# Patient Record
Sex: Female | Born: 2000 | Hispanic: No | Marital: Single | Smoking: Never smoker
Health system: Southern US, Community
[De-identification: ages and names within clinical notes are randomized; demographics above are authoritative.]

---
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
## 2019-04-08 DIAGNOSIS — Z1159 Encounter for screening for other viral diseases: Secondary | ICD-10-CM | POA: Diagnosis not present

## 2019-08-19 DIAGNOSIS — Z20828 Contact with and (suspected) exposure to other viral communicable diseases: Secondary | ICD-10-CM | POA: Diagnosis not present

## 2019-08-23 DIAGNOSIS — Z20828 Contact with and (suspected) exposure to other viral communicable diseases: Secondary | ICD-10-CM | POA: Diagnosis not present

## 2019-11-03 DIAGNOSIS — Z20828 Contact with and (suspected) exposure to other viral communicable diseases: Secondary | ICD-10-CM | POA: Diagnosis not present

## 2020-01-05 DIAGNOSIS — Z20828 Contact with and (suspected) exposure to other viral communicable diseases: Secondary | ICD-10-CM | POA: Diagnosis not present

## 2020-01-15 DIAGNOSIS — Z20828 Contact with and (suspected) exposure to other viral communicable diseases: Secondary | ICD-10-CM | POA: Diagnosis not present

## 2020-01-21 DIAGNOSIS — Z20828 Contact with and (suspected) exposure to other viral communicable diseases: Secondary | ICD-10-CM | POA: Diagnosis not present

## 2020-01-27 DIAGNOSIS — Z20828 Contact with and (suspected) exposure to other viral communicable diseases: Secondary | ICD-10-CM | POA: Diagnosis not present

## 2020-02-19 DIAGNOSIS — Z20828 Contact with and (suspected) exposure to other viral communicable diseases: Secondary | ICD-10-CM | POA: Diagnosis not present

## 2020-02-19 DIAGNOSIS — Z03818 Encounter for observation for suspected exposure to other biological agents ruled out: Secondary | ICD-10-CM | POA: Diagnosis not present

## 2020-02-25 DIAGNOSIS — Z20828 Contact with and (suspected) exposure to other viral communicable diseases: Secondary | ICD-10-CM | POA: Diagnosis not present

## 2020-02-25 DIAGNOSIS — Z03818 Encounter for observation for suspected exposure to other biological agents ruled out: Secondary | ICD-10-CM | POA: Diagnosis not present

## 2020-05-04 DIAGNOSIS — Z309 Encounter for contraceptive management, unspecified: Secondary | ICD-10-CM | POA: Diagnosis not present

## 2020-05-04 DIAGNOSIS — Z0000 Encounter for general adult medical examination without abnormal findings: Secondary | ICD-10-CM | POA: Diagnosis not present

## 2020-05-04 DIAGNOSIS — N946 Dysmenorrhea, unspecified: Secondary | ICD-10-CM | POA: Diagnosis not present

## 2020-10-28 DIAGNOSIS — Z20822 Contact with and (suspected) exposure to covid-19: Secondary | ICD-10-CM | POA: Diagnosis not present

## 2021-05-09 DIAGNOSIS — Z0000 Encounter for general adult medical examination without abnormal findings: Secondary | ICD-10-CM | POA: Diagnosis not present

## 2021-05-09 DIAGNOSIS — Z13220 Encounter for screening for lipoid disorders: Secondary | ICD-10-CM | POA: Diagnosis not present

## 2021-05-09 DIAGNOSIS — R7989 Other specified abnormal findings of blood chemistry: Secondary | ICD-10-CM | POA: Diagnosis not present

## 2021-05-09 DIAGNOSIS — R194 Change in bowel habit: Secondary | ICD-10-CM | POA: Diagnosis not present

## 2021-05-09 DIAGNOSIS — R7309 Other abnormal glucose: Secondary | ICD-10-CM | POA: Diagnosis not present

## 2021-05-09 DIAGNOSIS — Z124 Encounter for screening for malignant neoplasm of cervix: Secondary | ICD-10-CM | POA: Diagnosis not present

## 2024-10-06 ENCOUNTER — Encounter (HOSPITAL_BASED_OUTPATIENT_CLINIC_OR_DEPARTMENT_OTHER): Payer: Self-pay

## 2024-10-06 ENCOUNTER — Other Ambulatory Visit: Payer: Self-pay

## 2024-10-06 DIAGNOSIS — F17210 Nicotine dependence, cigarettes, uncomplicated: Secondary | ICD-10-CM | POA: Diagnosis present

## 2024-10-06 DIAGNOSIS — K8060 Calculus of gallbladder and bile duct with cholecystitis, unspecified, without obstruction: Principal | ICD-10-CM | POA: Diagnosis present

## 2024-10-06 LAB — CBC
HCT: 36 % (ref 36.0–46.0)
Hemoglobin: 12.2 g/dL (ref 12.0–15.0)
MCH: 29.5 pg (ref 26.0–34.0)
MCHC: 33.9 g/dL (ref 30.0–36.0)
MCV: 87.2 fL (ref 80.0–100.0)
Platelets: 348 K/uL (ref 150–400)
RBC: 4.13 MIL/uL (ref 3.87–5.11)
RDW: 12.7 % (ref 11.5–15.5)
WBC: 7.2 K/uL (ref 4.0–10.5)
nRBC: 0 % (ref 0.0–0.2)

## 2024-10-06 NOTE — ED Triage Notes (Signed)
 Pt arrives with c/o epigastric pain that started about 2 months ago. Pt reports the pain radiates into her back. Pt reports burping sometimes relieves pain. Pt denies n/v. Pt reports lightheadedness.

## 2024-10-07 ENCOUNTER — Emergency Department (HOSPITAL_BASED_OUTPATIENT_CLINIC_OR_DEPARTMENT_OTHER)

## 2024-10-07 ENCOUNTER — Encounter (HOSPITAL_COMMUNITY): Admission: EM | Disposition: A | Discharge status: Home / Self Care | Payer: Self-pay | Source: Home / Self Care

## 2024-10-07 ENCOUNTER — Emergency Department (HOSPITAL_COMMUNITY): Admitting: Anesthesiology

## 2024-10-07 ENCOUNTER — Emergency Department (HOSPITAL_COMMUNITY)

## 2024-10-07 ENCOUNTER — Encounter (HOSPITAL_COMMUNITY): Payer: Self-pay | Admitting: Anesthesiology

## 2024-10-07 ENCOUNTER — Inpatient Hospital Stay (HOSPITAL_BASED_OUTPATIENT_CLINIC_OR_DEPARTMENT_OTHER): Admission: EM | Admit: 2024-10-07 | Discharge: 2024-10-08 | Disposition: A | Payer: Self-pay

## 2024-10-07 DIAGNOSIS — K8050 Calculus of bile duct without cholangitis or cholecystitis without obstruction: Principal | ICD-10-CM

## 2024-10-07 DIAGNOSIS — Z7401 Bed confinement status: Secondary | ICD-10-CM | POA: Diagnosis not present

## 2024-10-07 DIAGNOSIS — K8020 Calculus of gallbladder without cholecystitis without obstruction: Secondary | ICD-10-CM | POA: Diagnosis present

## 2024-10-07 DIAGNOSIS — K8060 Calculus of gallbladder and bile duct with cholecystitis, unspecified, without obstruction: Secondary | ICD-10-CM | POA: Diagnosis present

## 2024-10-07 DIAGNOSIS — K819 Cholecystitis, unspecified: Secondary | ICD-10-CM | POA: Diagnosis not present

## 2024-10-07 DIAGNOSIS — I959 Hypotension, unspecified: Secondary | ICD-10-CM | POA: Diagnosis not present

## 2024-10-07 DIAGNOSIS — F17210 Nicotine dependence, cigarettes, uncomplicated: Secondary | ICD-10-CM | POA: Diagnosis present

## 2024-10-07 DIAGNOSIS — K828 Other specified diseases of gallbladder: Secondary | ICD-10-CM | POA: Diagnosis not present

## 2024-10-07 HISTORY — PX: CHOLECYSTECTOMY: SHX55

## 2024-10-07 LAB — COMPREHENSIVE METABOLIC PANEL WITH GFR
ALT: 131 U/L — ABNORMAL HIGH (ref 0–44)
AST: 131 U/L — ABNORMAL HIGH (ref 15–41)
Albumin: 4.4 g/dL (ref 3.5–5.0)
Alkaline Phosphatase: 144 U/L — ABNORMAL HIGH (ref 38–126)
Anion gap: 17 — ABNORMAL HIGH (ref 5–15)
BUN: 10 mg/dL (ref 6–20)
CO2: 18 mmol/L — ABNORMAL LOW (ref 22–32)
Calcium: 9.3 mg/dL (ref 8.9–10.3)
Chloride: 104 mmol/L (ref 98–111)
Creatinine, Ser: 0.94 mg/dL (ref 0.44–1.00)
GFR, Estimated: >60 mL/min (ref >60)
Glucose, Bld: 116 mg/dL — ABNORMAL HIGH (ref 70–99)
Potassium: 3.5 mmol/L (ref 3.5–5.1)
Sodium: 138 mmol/L (ref 135–145)
Total Bilirubin: 1.1 mg/dL (ref 0.0–1.2)
Total Protein: 7.7 g/dL (ref 6.5–8.1)

## 2024-10-07 LAB — URINALYSIS, MICROSCOPIC (REFLEX)

## 2024-10-07 LAB — URINALYSIS, ROUTINE W REFLEX MICROSCOPIC
Glucose, UA: 100 mg/dL — AB
Hgb urine dipstick: NEGATIVE
Ketones, ur: 15 mg/dL — AB
Leukocytes,Ua: NEGATIVE
Nitrite: NEGATIVE
Protein, ur: >=300 mg/dL — AB
Specific Gravity, Urine: 1.025 (ref 1.005–1.030)
pH: 7 (ref 5.0–8.0)

## 2024-10-07 LAB — LIPASE, BLOOD: Lipase: 66 U/L — ABNORMAL HIGH (ref 11–51)

## 2024-10-07 LAB — PREGNANCY, URINE: Preg Test, Ur: NEGATIVE

## 2024-10-07 SURGERY — LAPAROSCOPIC CHOLECYSTECTOMY WITH INTRAOPERATIVE CHOLANGIOGRAM
Anesthesia: General

## 2024-10-07 MED ORDER — CHLORHEXIDINE GLUCONATE 0.12 % MT SOLN
15.0000 mL | Freq: Once | OROMUCOSAL | Status: AC
  Administered 2024-10-07: 11:00:00 15 mL via OROMUCOSAL

## 2024-10-07 MED ORDER — DROPERIDOL 2.5 MG/ML IJ SOLN
0.6250 mg | Freq: Once | INTRAMUSCULAR | Status: AC | PRN
  Administered 2024-10-07: 15:00:00 0.625 mg via INTRAVENOUS

## 2024-10-07 MED ORDER — FENTANYL CITRATE (PF) 250 MCG/5ML IJ SOLN
INTRAMUSCULAR | Status: DC | PRN
  Administered 2024-10-07 (×4): 50 ug via INTRAVENOUS

## 2024-10-07 MED ORDER — PROPOFOL 10 MG/ML IV BOLUS
INTRAVENOUS | Status: DC | PRN
  Administered 2024-10-07: 14:00:00 160 mg via INTRAVENOUS

## 2024-10-07 MED ORDER — ACETAMINOPHEN 500 MG PO TABS
1000.0000 mg | ORAL_TABLET | ORAL | Status: AC
  Administered 2024-10-07: 10:00:00 1000 mg via ORAL
  Filled 2024-10-07: qty 2

## 2024-10-07 MED ORDER — FENTANYL CITRATE (PF) 100 MCG/2ML IJ SOLN
INTRAMUSCULAR | Status: AC
  Filled 2024-10-07: qty 2

## 2024-10-07 MED ORDER — MIDAZOLAM HCL 2 MG/2ML IJ SOLN
INTRAMUSCULAR | Status: AC
  Filled 2024-10-07: qty 2

## 2024-10-07 MED ORDER — ONDANSETRON HCL 4 MG/2ML IJ SOLN
INTRAMUSCULAR | Status: DC | PRN
  Administered 2024-10-07: 15:00:00 4 mg via INTRAVENOUS

## 2024-10-07 MED ORDER — ONDANSETRON HCL 4 MG/2ML IJ SOLN: 4.0000 mg | INTRAMUSCULAR | Status: DC | PRN

## 2024-10-07 MED ORDER — ONDANSETRON 4 MG PO TBDP: 4.0000 mg | ORAL_TABLET | Freq: Four times a day (QID) | ORAL | Status: DC | PRN

## 2024-10-07 MED ORDER — ONDANSETRON HCL 4 MG/2ML IJ SOLN
INTRAMUSCULAR | Status: AC
  Filled 2024-10-07: qty 2

## 2024-10-07 MED ORDER — SODIUM CHLORIDE 0.9 % IV SOLN: INTRAVENOUS | Status: DC

## 2024-10-07 MED ORDER — KETOROLAC TROMETHAMINE 30 MG/ML IJ SOLN
INTRAMUSCULAR | Status: DC | PRN
  Administered 2024-10-07: 15:00:00 30 mg via INTRAVENOUS

## 2024-10-07 MED ORDER — 0.9 % SODIUM CHLORIDE (POUR BTL) OPTIME
TOPICAL | Status: DC | PRN
  Administered 2024-10-07: 15:00:00 1000 mL

## 2024-10-07 MED ORDER — ACETAMINOPHEN 10 MG/ML IV SOLN: 1000.0000 mg | Freq: Once | INTRAVENOUS | Status: DC | PRN

## 2024-10-07 MED ORDER — OXYCODONE HCL 5 MG/5ML PO SOLN: 5.0000 mg | Freq: Once | ORAL | Status: DC | PRN

## 2024-10-07 MED ORDER — MORPHINE SULFATE (PF) 2 MG/ML IV SOLN
1.0000 mg | INTRAVENOUS | Status: DC | PRN
  Administered 2024-10-07: 22:00:00 1 mg via INTRAVENOUS
  Filled 2024-10-07: qty 1

## 2024-10-07 MED ORDER — SODIUM CHLORIDE 0.9 % IV BOLUS
1000.0000 mL | Freq: Once | INTRAVENOUS | Status: AC
  Administered 2024-10-07: 03:00:00 1000 mL via INTRAVENOUS

## 2024-10-07 MED ORDER — KETOROLAC TROMETHAMINE 30 MG/ML IJ SOLN
INTRAMUSCULAR | Status: AC
  Filled 2024-10-07: qty 1

## 2024-10-07 MED ORDER — SODIUM CHLORIDE 0.9 % IV SOLN: Freq: Once | INTRAVENOUS | Status: AC

## 2024-10-07 MED ORDER — ORAL CARE MOUTH RINSE: 15.0000 mL | Freq: Once | OROMUCOSAL | Status: AC

## 2024-10-07 MED ORDER — DROPERIDOL 2.5 MG/ML IJ SOLN
INTRAMUSCULAR | Status: AC
  Filled 2024-10-07: qty 2

## 2024-10-07 MED ORDER — IOHEXOL 300 MG/ML  SOLN
INTRAMUSCULAR | Status: DC | PRN
  Administered 2024-10-07: 15:00:00 10 mL

## 2024-10-07 MED ORDER — FENTANYL CITRATE (PF) 50 MCG/ML IJ SOSY
25.0000 ug | PREFILLED_SYRINGE | INTRAMUSCULAR | Status: DC | PRN
  Administered 2024-10-07 (×2): 50 ug via INTRAVENOUS

## 2024-10-07 MED ORDER — IOHEXOL 300 MG/ML  SOLN
100.0000 mL | Freq: Once | INTRAMUSCULAR | Status: AC | PRN
  Administered 2024-10-07: 04:00:00 100 mL via INTRAVENOUS

## 2024-10-07 MED ORDER — BUPIVACAINE-EPINEPHRINE 0.25% -1:200000 IJ SOLN
INTRAMUSCULAR | Status: DC | PRN
  Administered 2024-10-07: 15:00:00 20 mL

## 2024-10-07 MED ORDER — LIDOCAINE HCL (CARDIAC) PF 100 MG/5ML IV SOSY
PREFILLED_SYRINGE | INTRAVENOUS | Status: DC | PRN
  Administered 2024-10-07: 14:00:00 60 mg via INTRAVENOUS

## 2024-10-07 MED ORDER — PANTOPRAZOLE SODIUM 40 MG IV SOLR
40.0000 mg | Freq: Every day | INTRAVENOUS | Status: DC
  Administered 2024-10-07: 22:00:00 40 mg via INTRAVENOUS
  Filled 2024-10-07: qty 10

## 2024-10-07 MED ORDER — LACTATED RINGERS IV SOLN: INTRAVENOUS | Status: DC

## 2024-10-07 MED ORDER — MIDAZOLAM HCL (PF) 2 MG/2ML IJ SOLN
INTRAMUSCULAR | Status: DC | PRN
  Administered 2024-10-07: 13:00:00 2 mg via INTRAVENOUS

## 2024-10-07 MED ORDER — BUPIVACAINE-EPINEPHRINE (PF) 0.25% -1:200000 IJ SOLN
INTRAMUSCULAR | Status: AC
  Filled 2024-10-07: qty 30

## 2024-10-07 MED ORDER — MORPHINE SULFATE (PF) 2 MG/ML IV SOLN: 1.0000 mg | INTRAVENOUS | Status: DC | PRN

## 2024-10-07 MED ORDER — FENTANYL CITRATE (PF) 50 MCG/ML IJ SOSY
PREFILLED_SYRINGE | INTRAMUSCULAR | Status: AC
  Filled 2024-10-07: qty 2

## 2024-10-07 MED ORDER — CEFAZOLIN SODIUM-DEXTROSE 2-4 GM/100ML-% IV SOLN
2.0000 g | Freq: Once | INTRAVENOUS | Status: AC
  Administered 2024-10-07: 14:00:00 2 g via INTRAVENOUS
  Filled 2024-10-07: qty 100

## 2024-10-07 MED ORDER — OXYCODONE HCL 5 MG PO TABS: 5.0000 mg | ORAL_TABLET | Freq: Once | ORAL | Status: DC | PRN

## 2024-10-07 MED ORDER — ONDANSETRON HCL 4 MG/2ML IJ SOLN: 4.0000 mg | Freq: Four times a day (QID) | INTRAMUSCULAR | Status: DC | PRN

## 2024-10-07 MED ORDER — ROCURONIUM BROMIDE 10 MG/ML (PF) SYRINGE
PREFILLED_SYRINGE | INTRAVENOUS | Status: DC | PRN
  Administered 2024-10-07: 14:00:00 50 mg via INTRAVENOUS

## 2024-10-07 MED ORDER — LIDOCAINE HCL (PF) 2 % IJ SOLN
INTRAMUSCULAR | Status: AC
  Filled 2024-10-07: qty 5

## 2024-10-07 MED ORDER — PROPOFOL 10 MG/ML IV BOLUS
INTRAVENOUS | Status: AC
  Filled 2024-10-07: qty 20

## 2024-10-07 MED ADMIN — Sugammadex Sodium IV 200 MG/2ML (Base Equivalent): 150 mg | INTRAVENOUS | @ 15:00:00 | NDC 00006542302

## 2024-10-07 MED ADMIN — Dexamethasone Sod Phosphate Preservative Free Inj 10 MG/ML: 6 mg | INTRAVENOUS | @ 14:00:00 | NDC 25021005301

## 2024-10-07 MED FILL — Fentanyl Citrate Preservative Free (PF) Inj 100 MCG/2ML: INTRAMUSCULAR | Qty: 2 | Status: AC

## 2024-10-07 SURGICAL SUPPLY — 32 items
BAG COUNTER SPONGE SURGICOUNT (BAG) IMPLANT
CATH REDDICK CHOLANGI 4FR 50CM (CATHETERS) ×1 IMPLANT
CHLORAPREP W/TINT 26 (MISCELLANEOUS) ×1 IMPLANT
CLIP APPLIE 5 13 M/L LIGAMAX5 (MISCELLANEOUS) ×1 IMPLANT
COVER MAYO STAND XLG (MISCELLANEOUS) ×1 IMPLANT
COVER SURGICAL LIGHT HANDLE (MISCELLANEOUS) ×1 IMPLANT
DERMABOND ADVANCED .7 DNX12 (GAUZE/BANDAGES/DRESSINGS) ×1 IMPLANT
DRAPE C-ARM 42X120 X-RAY (DRAPES) ×1 IMPLANT
ELECT REM PT RETURN 15FT ADLT (MISCELLANEOUS) ×1 IMPLANT
GAUZE PAD ABD 8X10 STRL (GAUZE/BANDAGES/DRESSINGS) IMPLANT
GAUZE SPONGE 4X4 12PLY STRL (GAUZE/BANDAGES/DRESSINGS) IMPLANT
GLOVE BIO SURGEON STRL SZ7.5 (GLOVE) ×1 IMPLANT
GOWN STRL REUS W/ TWL LRG LVL3 (GOWN DISPOSABLE) IMPLANT
HEMOSTAT SNOW SURGICEL 2X4 (HEMOSTASIS) IMPLANT
IRRIGATION SUCT STRKRFLW 2 WTP (MISCELLANEOUS) ×1 IMPLANT
IV CATH 14GX2 1/4 (CATHETERS) ×1 IMPLANT
KIT BASIN OR (CUSTOM PROCEDURE TRAY) ×1 IMPLANT
KIT IMAGING PINPOINTPAQ (MISCELLANEOUS) IMPLANT
KIT TURNOVER KIT A (KITS) ×1 IMPLANT
PENCIL SMOKE EVACUATOR (MISCELLANEOUS) IMPLANT
SCISSORS LAP 5X35 DISP (ENDOMECHANICALS) ×1 IMPLANT
SET TUBE SMOKE EVAC HIGH FLOW (TUBING) ×1 IMPLANT
SLEEVE Z-THREAD 5X100MM (TROCAR) ×2 IMPLANT
SPIKE FLUID TRANSFER (MISCELLANEOUS) ×1 IMPLANT
SUT MNCRL AB 4-0 PS2 18 (SUTURE) ×1 IMPLANT
SUT VICRYL 0 UR6 27IN ABS (SUTURE) ×2 IMPLANT
SYSTEM BAG RETRIEVAL 10MM (BASKET) ×1 IMPLANT
TAPE CLOTH SURG 4X10 WHT LF (GAUZE/BANDAGES/DRESSINGS) IMPLANT
TOWEL OR DSP ST BLU DLX 10/PK (DISPOSABLE) ×1 IMPLANT
TRAY LAPAROSCOPIC (CUSTOM PROCEDURE TRAY) ×1 IMPLANT
TROCAR BALLN 12MMX100 BLUNT (TROCAR) ×1 IMPLANT
TROCAR Z-THREAD OPTICAL 5X100M (TROCAR) ×1 IMPLANT

## 2024-10-07 NOTE — Op Note (Addendum)
 10/07/2024  2:45 PM  PATIENT:  Isabel Rivera  23 y.o. female  PRE-OPERATIVE DIAGNOSIS:  CHOLELITHIASIS  POST-OPERATIVE DIAGNOSIS:  CHOLELITHIASIS  PROCEDURE:  Procedures: LAPAROSCOPIC CHOLECYSTECTOMY WITH INTRAOPERATIVE CHOLANGIOGRAM (N/A)  SURGEON:  Surgeons and Role:    * Curvin Deward MOULD, MD - Primary  PHYSICIAN ASSISTANT:   ASSISTANTS: none   ANESTHESIA:   local and general  EBL:  minimal   BLOOD ADMINISTERED:none  DRAINS: none   LOCAL MEDICATIONS USED:  MARCAINE      SPECIMEN:  Source of Specimen:  gallbladder  DISPOSITION OF SPECIMEN:  PATHOLOGY  COUNTS:  YES  TOURNIQUET:  * No tourniquets in log *  DICTATION: .Dragon Dictation    Procedure: After informed consent was obtained the patient was brought to the operating room and placed in the supine position on the operating room table. After adequate induction of general anesthesia the patient's abdomen was prepped with ChloraPrep allowed to dry and draped in usual sterile manner. An appropriate timeout was performed. The area below the umbilicus was infiltrated with quarter percent  Marcaine . A small incision was made with a 15 blade knife. The incision was carried down through the subcutaneous tissue bluntly with a hemostat and [employer] retractors. The linea alba was identified. The linea alba was incised with a 15 blade knife and each side was grasped with Coker clamps. The preperitoneal space was then probed with a hemostat until the peritoneum was opened and access was gained to the abdominal cavity. A 0 Vicryl pursestring stitch was placed in the fascia surrounding the opening. A Hassan cannula was then placed through the opening and anchored in place with the previously placed Vicryl purse string stitch. The abdomen was insufflated with carbon dioxide without difficulty. A laparoscope was inserted through the [facility] cannula in the right upper quadrant was inspected. Next the epigastric region was infiltrated with %  Marcaine . A small incision was made with a 15 blade knife. A 5 mm port was placed bluntly through this incision into the abdominal cavity under direct vision. Next 2 sites were chosen laterally on the right side of the abdomen for placement of 5 mm ports. Each of these areas was infiltrated with quarter percent Marcaine . Small stab incisions were made with a 15 blade knife. 5 mm ports were then placed bluntly through these incisions into the abdominal cavity under direct vision without difficulty. A blunt grasper was placed through the lateralmost 5 mm port and used to grasp the dome of the gallbladder and elevate it anteriorly and superiorly. Another blunt grasper was placed through the other 5 mm port and used to retract the body and neck of the gallbladder. A dissector was placed through the epigastric port and using the electrocautery the peritoneal reflection at the gallbladder neck was opened. Blunt dissection was then carried out in this area until the gallbladder neck-cystic duct junction was readily identified and a good window was created. A single clip was placed on the gallbladder neck. A small  ductotomy was made just below the clip with laparoscopic scissors. A 14-gauge Angiocath was then placed through the anterior abdominal wall under direct vision. A Reddick cholangiogram catheter was then placed through the Angiocath and flushed. The catheter was then placed in the cystic duct and anchored in place with a clip. A cholangiogram was obtained that showed good emptying into the duodenum but a stone in the distal common bile duct. I could not reach it with my catheter because of the spiral nature of the  cystic duct.  The anchoring clip and catheters were then removed from the patient. 3 clips were placed proximally on the cystic duct and the duct was divided between the 2 sets of clips. Posterior to this the cystic artery was identified and again dissected bluntly in a circumferential manner until a  good window  was created. 2 clips were placed proximally and one distally on the artery and the artery was divided between the 2 sets of clips. Next a laparoscopic hook cautery device was used to separate the gallbladder from the liver bed. Prior to completely detaching the gallbladder from the liver bed the liver bed was inspected and several small bleeding points were coagulated with the electrocautery until the area was completely hemostatic. The gallbladder was then detached the rest of it from the liver bed without difficulty. A laparoscopic bag was inserted through the hassan port. The laparoscope was moved to the epigastric port. The gallbladder was placed within the bag and the bag was sealed.  The bag with the gallbladder was then removed with the [facility] cannula through the infraumbilical port without difficulty. The fascial defect was then closed with the previously placed Vicryl pursestring stitch as well as with another figure-of-eight 0 Vicryl stitch. The liver bed was inspected again and found to be hemostatic. The abdomen was irrigated with copious amounts of saline until the effluent was clear. The ports were then removed under direct vision without difficulty and were found to be hemostatic. The gas was allowed to escape. No other abnormalities were noted on general inspection of the abdomen. The skin incisions were all closed with interrupted 4-0 Monocryl subcuticular stitches. Dermabond dressings were applied. The patient tolerated the procedure well. At the end of the case all needle sponge and instrument counts were correct. The patient was then awakened and taken to recovery in stable condition  PLAN OF CARE: Admit to inpatient   PATIENT DISPOSITION:  PACU - hemodynamically stable.   Delay start of Pharmacological VTE agent (>24hrs) due to surgical blood loss or risk of bleeding: no

## 2024-10-07 NOTE — Transfer of Care (Signed)
 Immediate Anesthesia Transfer of Care Note  Patient: Isabel Rivera  Procedure(s) Performed: LAPAROSCOPIC CHOLECYSTECTOMY WITH INTRAOPERATIVE CHOLANGIOGRAM  Patient Location: PACU  Anesthesia Type:General  Level of Consciousness: oriented and drowsy  Airway & Oxygen Therapy: Patient Spontanous Breathing and Patient connected to face mask oxygen  Post-op Assessment: Report given to RN and Post -op Vital signs reviewed and stable  Post vital signs: Reviewed and stable  Last Vitals:  Vitals Value Taken Time  BP 119/82 10/07/24 15:00  Temp 97.3   Pulse 89 10/07/24 15:03  Resp 18 10/07/24 15:03  SpO2 100 % 10/07/24 15:03  Vitals shown include unfiled device data.  Last Pain:  Vitals:   10/07/24 0920  TempSrc: Oral  PainSc: 0-No pain      Patients Stated Pain Goal: 4 (10/07/24 0920)  Complications: No notable events documented.

## 2024-10-07 NOTE — Anesthesia Preprocedure Evaluation (Addendum)
 Anesthesia Evaluation  Patient identified by MRN, date of birth, ID band Patient awake    Reviewed: Allergy & Precautions, NPO status , Patient's Chart, lab work & pertinent test results  History of Anesthesia Complications Negative for: history of anesthetic complications  Airway Mallampati: II  TM Distance: >3 FB Neck ROM: Full    Dental no notable dental hx. (+) Teeth Intact   Pulmonary neg sleep apnea, neg COPD, Current Smoker and Patient abstained from smoking.   Pulmonary exam normal breath sounds clear to auscultation       Cardiovascular Exercise Tolerance: Good METS(-) hypertension(-) CAD and (-) Past MI negative cardio ROS (-) dysrhythmias  Rhythm:Regular Rate:Normal - Systolic murmurs    Neuro/Psych negative neurological ROS  negative psych ROS   GI/Hepatic ,neg GERD  ,,(+)     (-) substance abuse  Cholecystitis. Denies N/V.   Endo/Other  neg diabetes    Renal/GU negative Renal ROS     Musculoskeletal   Abdominal   Peds  Hematology   Anesthesia Other Findings History reviewed. No pertinent past medical history.  Reproductive/Obstetrics                              Anesthesia Physical Anesthesia Plan  ASA: 2  Anesthesia Plan: General   Post-op Pain Management: Tylenol  PO (pre-op)* and Toradol  IV (intra-op)*   Induction: Intravenous  PONV Risk Score and Plan: 4 or greater and Ondansetron , Dexamethasone  and Midazolam   Airway Management Planned: Oral ETT  Additional Equipment: None  Intra-op Plan:   Post-operative Plan: Extubation in OR  Informed Consent: I have reviewed the patients History and Physical, chart, labs and discussed the procedure including the risks, benefits and alternatives for the proposed anesthesia with the patient or authorized representative who has indicated his/her understanding and acceptance.     Dental advisory given  Plan Discussed  with: CRNA and Surgeon  Anesthesia Plan Comments: (Discussed risks of anesthesia with patient, including PONV, sore throat, lip/dental/eye damage. Rare risks discussed as well, such as cardiorespiratory and neurological sequelae, and allergic reactions. Discussed the role of CRNA in patient's perioperative care. Patient understands. Patient counseled on benefits of smoking cessation, and increased perioperative risks associated with continued smoking. )         Anesthesia Quick Evaluation

## 2024-10-07 NOTE — Plan of Care (Signed)
   Problem: Education: Goal: Knowledge of General Education information will improve Description Including pain rating scale, medication(s)/side effects and non-pharmacologic comfort measures Outcome: Progressing   Problem: Health Behavior/Discharge Planning: Goal: Ability to manage health-related needs will improve Outcome: Progressing

## 2024-10-07 NOTE — Discharge Instructions (Signed)

## 2024-10-07 NOTE — ED Notes (Signed)
 Report given to [facility]. Pt aware of pending transport and consent signed . Last PO intake 1700 10/06/24

## 2024-10-07 NOTE — Plan of Care (Signed)
   Problem: Clinical Measurements: Goal: Diagnostic test results will improve Outcome: Progressing   Problem: Activity: Goal: Risk for activity intolerance will decrease Outcome: Progressing

## 2024-10-07 NOTE — H&P (Signed)
 Isabel Rivera is an 23 y.o. female.   Chief Complaint: Abdominal pain HPI: The patient is a 23 year old female who began having epigastric abdominal pain back in June.  Initially the pain was happening about once a month but seems to be happening more frequently now she does feel bloated.  She denies any nausea or vomiting.  She came to the emergency department where a ultrasound did show stones in the gallbladder.  She may have had some mild gallbladder wall thickening.  Some of her liver functions were slightly elevated.  She feels a little bit better after arriving to the hospital.  She is otherwise young and in good health.  She does smoke.  History reviewed. No pertinent past medical history.  History reviewed. No pertinent surgical history.  History reviewed. No pertinent family history. Social History:  reports that she has never smoked. She has never used smokeless tobacco. She reports that she does not drink alcohol and does not use drugs.  Allergies: Allergies[1]  Medications Prior to Admission  Medication Sig Dispense Refill   acetaminophen  (TYLENOL ) 325 MG tablet Take 650 mg by mouth every 6 (six) hours as needed.     bismuth subsalicylate (PEPTO BISMOL) 262 MG/15ML suspension Take 30 mLs by mouth every 6 (six) hours as needed.     SPRINTEC 28 0.25-35 MG-MCG tablet Take 1 tablet by mouth daily.      Results for orders placed or performed during the hospital encounter of 10/07/24 (from the past 48 hours)  Lipase, blood     Status: Abnormal   Collection Time: 10/06/24 11:25 PM  Result Value Ref Range   Lipase 66 (H) 11 - 51 U/L    Comment: Performed at [facility], [address] [place], [place] [address]  Comprehensive metabolic panel     Status: Abnormal   Collection Time: 10/06/24 11:25 PM  Result Value Ref Range   Sodium 138 135 - 145 mmol/L   Potassium 3.5 3.5 - 5.1 mmol/L   Chloride 104 98 - 111 mmol/L   CO2 18 (L) 22 - 32 mmol/L   Glucose, Bld 116 (H) 70  - 99 mg/dL    Comment: Glucose reference range applies only to samples taken after fasting for at least 8 hours.   BUN 10 6 - 20 mg/dL   Creatinine, Ser 9.05 0.44 - 1.00 mg/dL   Calcium 9.3 8.9 - 89.6 mg/dL   Total Protein 7.7 6.5 - 8.1 g/dL   Albumin 4.4 3.5 - 5.0 g/dL   AST 868 (H) 15 - 41 U/L   ALT 131 (H) 0 - 44 U/L   Alkaline Phosphatase 144 (H) 38 - 126 U/L   Total Bilirubin 1.1 0.0 - 1.2 mg/dL   GFR, Estimated >39 >39 mL/min    Comment: (NOTE) Calculated using the CKD-EPI Creatinine Equation (2021)    Anion gap 17 (H) 5 - 15    Comment: Performed at [facility], [address] [place], [place] [address]  CBC     Status: None   Collection Time: 10/06/24 11:25 PM  Result Value Ref Range   WBC 7.2 4.0 - 10.5 K/uL   RBC 4.13 3.87 - 5.11 MIL/uL   Hemoglobin 12.2 12.0 - 15.0 g/dL   HCT 63.9 63.9 - 53.9 %   MCV 87.2 80.0 - 100.0 fL   MCH 29.5 26.0 - 34.0 pg   MCHC 33.9 30.0 - 36.0 g/dL   RDW 87.2 88.4 -  15.5 %   Platelets 348 150 - 400 K/uL   nRBC 0.0 0.0 - 0.2 %    Comment: Performed at [facility], [address] [place], [place] [address]  Urinalysis, Routine w reflex microscopic -Urine, Clean Catch     Status: Abnormal   Collection Time: 10/07/24  2:15 AM  Result Value Ref Range   Color, Urine ORANGE (A) YELLOW    Comment: BIOCHEMICALS MAY BE AFFECTED BY COLOR   APPearance CLOUDY (A) CLEAR   Specific Gravity, Urine 1.025 1.005 - 1.030   pH 7.0 5.0 - 8.0   Glucose, UA 100 (A) NEGATIVE mg/dL   Hgb urine dipstick NEGATIVE NEGATIVE   Bilirubin Urine MODERATE (A) NEGATIVE   Ketones, ur 15 (A) NEGATIVE mg/dL   Protein, ur >=699 (A) NEGATIVE mg/dL   Nitrite NEGATIVE NEGATIVE   Leukocytes,Ua NEGATIVE NEGATIVE    Comment: Performed at [facility], [address] [place], [place] [address]  Pregnancy, urine     Status: None   Collection Time: 10/07/24  2:15 AM  Result Value Ref Range   Preg Test, Ur NEGATIVE NEGATIVE    Comment:         THE SENSITIVITY OF THIS METHODOLOGY IS >20 mIU/mL. Performed at [facility], [address] [place], [place] [address]   Urinalysis, Microscopic (reflex)     Status: Abnormal   Collection Time: 10/07/24  2:15 AM  Result Value Ref Range   RBC / HPF 0-5 0 - 5 RBC/hpf   WBC, UA 0-5 0 - 5 WBC/hpf   Bacteria, UA MANY (A) NONE SEEN   Squamous Epithelial / HPF 0-5 0 - 5 /HPF   Mucus PRESENT    Hyaline Casts, UA PRESENT    Ca Oxalate Crys, UA PRESENT    Urine-Other LESS THAN 10 mL OF URINE SUBMITTED     Comment: Performed at [facility], [address] [place], [place] [address]   US  Abdomen Limited RUQ (LIVER/GB) Result Date: 10/07/2024 EXAM: Right Upper Quadrant Abdominal Ultrasound 10/07/2024 06:19:00 AM TECHNIQUE: Real-time ultrasonography of the right upper quadrant of the abdomen was performed. COMPARISON: CT of the abdomen and pelvis dated 10/07/2024. CLINICAL HISTORY: Transaminitis. FINDINGS: LIVER: The liver demonstrates normal echogenicity. No intrahepatic biliary ductal dilatation. No evidence of mass. BILIARY SYSTEM: No pericholecystic fluid or wall thickening. There are several stones within the gallbladder, with the largest measuring approximately 11 mm in diameter. The gallbladder is contracted. The patient is not focally tender over the gallbladder. The common bile duct measures 4 mm in diameter. RIGHT KIDNEY: The right kidney is grossly unremarkable in appearances without evidence of hydronephrosis, echogenic calculi or worrisome mass lesions. PANCREAS: Visualized portions of the pancreas are unremarkable. OTHER: No right upper quadrant ascites. IMPRESSION: 1. Contracted gallbladder with cholelithiasis, largest stone measuring approximately 11 mm. 2. No sonographic Murphy sign. Electronically signed by: Evalene Coho MD 10/07/2024 06:32 AM EST RP Workstation: [id number]   CT ABDOMEN PELVIS W CONTRAST Result Date: 10/07/2024 EXAM: CT ABDOMEN AND PELVIS  WITH CONTRAST 10/07/2024 03:42:08 AM TECHNIQUE: CT of the abdomen and pelvis was performed with the administration of 100 mL of iohexol  (OMNIPAQUE ) 300 MG/ML solution. Multiplanar reformatted images are provided for review. Automated exposure control, iterative reconstruction, and/or weight-based adjustment of the mA/kV was utilized to reduce the radiation dose to as low as reasonably achievable. COMPARISON: None available. CLINICAL HISTORY: Epigastric pain for 2 months. FINDINGS: LOWER CHEST: No  acute abnormality. LIVER: The liver is unremarkable. GALLBLADDER AND BILE DUCTS: Gallbladder is unremarkable. No biliary ductal dilatation. SPLEEN: No acute abnormality. PANCREAS: No acute abnormality. ADRENAL GLANDS: No acute abnormality. KIDNEYS, URETERS AND BLADDER: No stones in the kidneys or ureters. No hydronephrosis. No perinephric or periureteral stranding. Urinary bladder is unremarkable. GI AND BOWEL: Stomach demonstrates no acute abnormality. There is no bowel obstruction. PERITONEUM AND RETROPERITONEUM: No ascites. No free air. VASCULATURE: Aorta is normal in caliber. LYMPH NODES: No lymphadenopathy. REPRODUCTIVE ORGANS: No acute abnormality. BONES AND SOFT TISSUES: No acute osseous abnormality. No focal soft tissue abnormality. IMPRESSION: 1. No acute findings in the abdomen or pelvis. Electronically signed by: Oneil Devonshire MD 10/07/2024 03:45 AM EST RP Workstation: [id number]    Review of Systems  Constitutional: Negative.   HENT: Negative.    Eyes: Negative.   Respiratory: Negative.    Cardiovascular: Negative.   Gastrointestinal:  Positive for abdominal pain. Negative for vomiting.  Endocrine: Negative.   Genitourinary: Negative.   Musculoskeletal: Negative.   Skin: Negative.   Allergic/Immunologic: Negative.   Neurological: Negative.   Hematological: Negative.   Psychiatric/Behavioral: Negative.      Blood pressure 109/64, pulse 65, temperature 98.1 F (36.7 C), temperature source Oral,  resp. rate 16, weight 62.6 kg, SpO2 97%. Physical Exam Vitals reviewed.  Constitutional:      General: She is not in acute distress.    Appearance: Normal appearance.  HENT:     Head: Normocephalic and atraumatic.     Right Ear: External ear normal.     Left Ear: External ear normal.     Nose: Nose normal.     Mouth/Throat:     Mouth: Mucous membranes are moist.     Pharynx: Oropharynx is clear.  Eyes:     Extraocular Movements: Extraocular movements intact.     Conjunctiva/sclera: Conjunctivae normal.     Pupils: Pupils are equal, round, and reactive to light.  Cardiovascular:     Rate and Rhythm: Normal rate and regular rhythm.     Pulses: Normal pulses.     Heart sounds: Normal heart sounds.  Pulmonary:     Effort: Pulmonary effort is normal. No respiratory distress.     Breath sounds: Normal breath sounds.  Abdominal:     General: Abdomen is flat.     Palpations: Abdomen is soft.     Comments: Mild epigastric tenderness  Musculoskeletal:        General: No swelling or deformity. Normal range of motion.     Cervical back: Normal range of motion and neck supple.  Skin:    General: Skin is warm and dry.  Neurological:     General: No focal deficit present.     Mental Status: She is alert and oriented to person, place, and time.  Psychiatric:        Mood and Affect: Mood normal.        Behavior: Behavior normal.      Assessment/Plan The patient appears to have symptomatic gallstones.  Because of the risk of further painful episodes and possible pancreatitis I feel she would benefit from having her gallbladder removed.  She would also like to have it done.  I have discussed with her in detail the risks and benefits of the operation as well as some of the technical aspects including the risk of common bile duct injury and she understands and wishes to proceed.  We will plan for laparoscopic cholecystectomy with possible intraoperative cholangiogram today.  Deward Null III,  MD 10/07/2024, 10:28 AM       [1] No Known Allergies

## 2024-10-07 NOTE — ED Provider Notes (Signed)
 [place] [facility] AT [facility] Provider Note   CSN: [id number] Arrival date & time: 10/06/24  2307     Patient presents with: Abdominal Pain   Isabel Rivera is a 23 y.o. female.   The history is provided by the patient.  Abdominal Pain Isabel Rivera is a 23 y.o. female who presents to the Emergency Department complaining of abdominal pain.  She presents to the emergency department for evaluation of intermittent abdominal pain since August.  She has been experiencing episodes that mostly wake her from sleep but have occurred at other times.  Pain is located in the epigastric region.  Her episodes are becoming more intense and more frequent.  Symptoms started when she woke this morning.  They have been ongoing throughout the day.  Pain is described as pulsating.  She does have some associated mild indigestion.  Tonight the pain started radiating to her back.  It does not lateralize.  She does at times have increased belching.  She had diaphoresis with her episode tonight.  She also had nausea but no vomiting.  No associated fever, diarrhea, constipation, dysuria.  She has no known medical problems.  She does take oral contraceptives.  No prior abdominal surgeries.  She did try Tylenol  as well as Pepto today without improvement in her symptoms.  She does use tobacco.  She uses occasional alcohol, no drugs.       Prior to Admission medications  Not on File    Allergies: Patient has no known allergies.    Review of Systems  Gastrointestinal:  Positive for abdominal pain.  All other systems reviewed and are negative.   Updated Vital Signs BP 103/60   Pulse 74   Temp 98.2 F (36.8 C) (Oral)   Resp 17   Wt 62.6 kg   SpO2 100%   Physical Exam Vitals and nursing note reviewed.  Constitutional:      Appearance: She is well-developed.  HENT:     Head: Normocephalic and atraumatic.  Cardiovascular:     Rate and Rhythm: Normal rate and regular rhythm.   Pulmonary:     Effort: Pulmonary effort is normal. No respiratory distress.  Abdominal:     Palpations: Abdomen is soft.     Tenderness: There is no abdominal tenderness. There is no guarding or rebound.  Musculoskeletal:        General: No tenderness.  Skin:    General: Skin is warm and dry.  Neurological:     Mental Status: She is alert and oriented to person, place, and time.  Psychiatric:        Behavior: Behavior normal.     (all labs ordered are listed, but only abnormal results are displayed) Labs Reviewed  LIPASE, BLOOD - Abnormal; Notable for the following components:      Result Value   Lipase 66 (*)    All other components within normal limits  COMPREHENSIVE METABOLIC PANEL WITH GFR - Abnormal; Notable for the following components:   CO2 18 (*)    Glucose, Bld 116 (*)    AST 131 (*)    ALT 131 (*)    Alkaline Phosphatase 144 (*)    Anion gap 17 (*)    All other components within normal limits  URINALYSIS, ROUTINE W REFLEX MICROSCOPIC - Abnormal; Notable for the following components:   Color, Urine ORANGE (*)    APPearance CLOUDY (*)    Glucose, UA 100 (*)    Bilirubin Urine  MODERATE (*)    Ketones, ur 15 (*)    Protein, ur >=300 (*)    All other components within normal limits  URINALYSIS, MICROSCOPIC (REFLEX) - Abnormal; Notable for the following components:   Bacteria, UA MANY (*)    All other components within normal limits  CBC  PREGNANCY, URINE    EKG: None  Radiology: US  Abdomen Limited RUQ (LIVER/GB) Result Date: 10/07/2024 EXAM: Right Upper Quadrant Abdominal Ultrasound 10/07/2024 06:19:00 AM TECHNIQUE: Real-time ultrasonography of the right upper quadrant of the abdomen was performed. COMPARISON: CT of the abdomen and pelvis dated 10/07/2024. CLINICAL HISTORY: Transaminitis. FINDINGS: LIVER: The liver demonstrates normal echogenicity. No intrahepatic biliary ductal dilatation. No evidence of mass. BILIARY SYSTEM: No pericholecystic fluid or  wall thickening. There are several stones within the gallbladder, with the largest measuring approximately 11 mm in diameter. The gallbladder is contracted. The patient is not focally tender over the gallbladder. The common bile duct measures 4 mm in diameter. RIGHT KIDNEY: The right kidney is grossly unremarkable in appearances without evidence of hydronephrosis, echogenic calculi or worrisome mass lesions. PANCREAS: Visualized portions of the pancreas are unremarkable. OTHER: No right upper quadrant ascites. IMPRESSION: 1. Contracted gallbladder with cholelithiasis, largest stone measuring approximately 11 mm. 2. No sonographic Murphy sign. Electronically signed by: Evalene Coho MD 10/07/2024 06:32 AM EST RP Workstation: [id number]   CT ABDOMEN PELVIS W CONTRAST Result Date: 10/07/2024 EXAM: CT ABDOMEN AND PELVIS WITH CONTRAST 10/07/2024 03:42:08 AM TECHNIQUE: CT of the abdomen and pelvis was performed with the administration of 100 mL of iohexol  (OMNIPAQUE ) 300 MG/ML solution. Multiplanar reformatted images are provided for review. Automated exposure control, iterative reconstruction, and/or weight-based adjustment of the mA/kV was utilized to reduce the radiation dose to as low as reasonably achievable. COMPARISON: None available. CLINICAL HISTORY: Epigastric pain for 2 months. FINDINGS: LOWER CHEST: No acute abnormality. LIVER: The liver is unremarkable. GALLBLADDER AND BILE DUCTS: Gallbladder is unremarkable. No biliary ductal dilatation. SPLEEN: No acute abnormality. PANCREAS: No acute abnormality. ADRENAL GLANDS: No acute abnormality. KIDNEYS, URETERS AND BLADDER: No stones in the kidneys or ureters. No hydronephrosis. No perinephric or periureteral stranding. Urinary bladder is unremarkable. GI AND BOWEL: Stomach demonstrates no acute abnormality. There is no bowel obstruction. PERITONEUM AND RETROPERITONEUM: No ascites. No free air. VASCULATURE: Aorta is normal in caliber. LYMPH NODES: No  lymphadenopathy. REPRODUCTIVE ORGANS: No acute abnormality. BONES AND SOFT TISSUES: No acute osseous abnormality. No focal soft tissue abnormality. IMPRESSION: 1. No acute findings in the abdomen or pelvis. Electronically signed by: Oneil Devonshire MD 10/07/2024 03:45 AM EST RP Workstation: [id number]     Procedures   EMERGENCY DEPARTMENT BILIARY ULTRASOUND INTERPRETATION Study: Limited Abdominal Ultrasound of the Gallbladder and Common Bile Duct.  INDICATIONS: Abdominal pain Indication: Multiple views of the gallbladder and common bile duct were obtained in real-time with a Multi-frequency probe.  PERFORMED BY:  Myself IMAGES ARCHIVED?: Yes LIMITATIONS: Bowel gas INTERPRETATION: Cholelithiasis  Medications Ordered in the ED  sodium chloride  0.9 % bolus 1,000 mL (1,000 mLs Intravenous New Bag/Given 10/07/24 0313)  iohexol  (OMNIPAQUE ) 300 MG/ML solution 100 mL (100 mLs Intravenous Contrast Given 10/07/24 0335)  0.9 %  sodium chloride  infusion ( Intravenous New Bag/Given 10/07/24 0704)                                    Medical Decision Making Amount and/or Complexity of Data Reviewed Labs: ordered. Radiology: ordered.  Risk Prescription drug management. Decision regarding hospitalization.   Patient here for evaluation of intermittent epigastric pain.  She did have severe pain at time of ED presentation but did resolve at time of examination in the ED room.  Labs with mild elevation in transaminases.  At time of initial assessment ultrasound was not available.  Informal bedside ultrasound does demonstrate cholelithiasis.  CT abdomen pelvis was obtained, which is negative for acute abnormality.  Patient was later able to get formal ultrasound in the emergency department, which demonstrates cholelithiasis and contracted gallbladder.  Patient has been experiencing escalating intermittent episodes of pain and tonight episode was very severe and prolonged.  Concern for symptomatic biliary  colic.  Discussed with Dr. Cameron with general surgery, agrees to accept at [facility] for surgical management.  Patient updated findings of studies and she is in agreement with transfer for ongoing care.   No current evidence of obstruction, pancreatitis, appendicitis.    Final diagnoses:  Biliary colic    ED Discharge Orders     None          Griselda Norris, MD 10/07/24 [phone]

## 2024-10-07 NOTE — Consult Note (Signed)
 Reason for Consult: CBD stone Referring Physician: Surgical team  Isabel Rivera is an 23 y.o. female.  HPI: Patient seen in recovery room and her hospital computer chart reviewed as well as her IntraOp cholangiogram and her stomach problems started this summer and she saw her PCP and was told it was probably due to constipation but no tests were done and liver tests were normal at that point but when her pain got worse she presented to the hospital and was diagnosed with gallstones and slightly elevated liver test and on IntraOp cholangiogram after her cholecystectomy a CBD stone was seen and we are consulted for further workup and plans and both her mother and grandmother have had gallstones and today was her only previous surgery  History reviewed. No pertinent past medical history.  History reviewed. No pertinent surgical history.  History reviewed. No pertinent family history.  Social History:  reports that she has never smoked. She has never used smokeless tobacco. She reports that she does not drink alcohol and does not use drugs.  Allergies: Allergies[1]  Medications: I have reviewed the patient's current medications.  Results for orders placed or performed during the hospital encounter of 10/07/24 (from the past 48 hours)  Lipase, blood     Status: Abnormal   Collection Time: 10/06/24 11:25 PM  Result Value Ref Range   Lipase 66 (H) 11 - 51 U/L    Comment: Performed at [facility], [address] [place], [place] [address]  Comprehensive metabolic panel     Status: Abnormal   Collection Time: 10/06/24 11:25 PM  Result Value Ref Range   Sodium 138 135 - 145 mmol/L   Potassium 3.5 3.5 - 5.1 mmol/L   Chloride 104 98 - 111 mmol/L   CO2 18 (L) 22 - 32 mmol/L   Glucose, Bld 116 (H) 70 - 99 mg/dL    Comment: Glucose reference range applies only to samples taken after fasting for at least 8 hours.   BUN 10 6 - 20 mg/dL   Creatinine, Ser 9.05 0.44 - 1.00 mg/dL   Calcium  9.3 8.9 - 89.6 mg/dL   Total Protein 7.7 6.5 - 8.1 g/dL   Albumin 4.4 3.5 - 5.0 g/dL   AST 868 (H) 15 - 41 U/L   ALT 131 (H) 0 - 44 U/L   Alkaline Phosphatase 144 (H) 38 - 126 U/L   Total Bilirubin 1.1 0.0 - 1.2 mg/dL   GFR, Estimated >39 >39 mL/min    Comment: (NOTE) Calculated using the CKD-EPI Creatinine Equation (2021)    Anion gap 17 (H) 5 - 15    Comment: Performed at [facility], [address] [place], [place] [address]  CBC     Status: None   Collection Time: 10/06/24 11:25 PM  Result Value Ref Range   WBC 7.2 4.0 - 10.5 K/uL   RBC 4.13 3.87 - 5.11 MIL/uL   Hemoglobin 12.2 12.0 - 15.0 g/dL   HCT 63.9 63.9 - 53.9 %   MCV 87.2 80.0 - 100.0 fL   MCH 29.5 26.0 - 34.0 pg   MCHC 33.9 30.0 - 36.0 g/dL   RDW 87.2 88.4 - 84.4 %   Platelets 348 150 - 400 K/uL   nRBC 0.0 0.0 - 0.2 %    Comment: Performed at [facility], [address] [place], [place] [address]  Urinalysis, Routine w reflex microscopic -Urine, Clean Catch  Status: Abnormal   Collection Time: 10/07/24  2:15 AM  Result Value Ref Range   Color, Urine ORANGE (A) YELLOW    Comment: BIOCHEMICALS MAY BE AFFECTED BY COLOR   APPearance CLOUDY (A) CLEAR   Specific Gravity, Urine 1.025 1.005 - 1.030   pH 7.0 5.0 - 8.0   Glucose, UA 100 (A) NEGATIVE mg/dL   Hgb urine dipstick NEGATIVE NEGATIVE   Bilirubin Urine MODERATE (A) NEGATIVE   Ketones, ur 15 (A) NEGATIVE mg/dL   Protein, ur >=699 (A) NEGATIVE mg/dL   Nitrite NEGATIVE NEGATIVE   Leukocytes,Ua NEGATIVE NEGATIVE    Comment: Performed at [facility], [address] [place], [place] [address]  Pregnancy, urine     Status: None   Collection Time: 10/07/24  2:15 AM  Result Value Ref Range   Preg Test, Ur NEGATIVE NEGATIVE    Comment:        THE SENSITIVITY OF THIS METHODOLOGY IS >20 mIU/mL. Performed at [facility], [address] [place], [place] [address]   Urinalysis, Microscopic (reflex)     Status:  Abnormal   Collection Time: 10/07/24  2:15 AM  Result Value Ref Range   RBC / HPF 0-5 0 - 5 RBC/hpf   WBC, UA 0-5 0 - 5 WBC/hpf   Bacteria, UA MANY (A) NONE SEEN   Squamous Epithelial / HPF 0-5 0 - 5 /HPF   Mucus PRESENT    Hyaline Casts, UA PRESENT    Ca Oxalate Crys, UA PRESENT    Urine-Other LESS THAN 10 mL OF URINE SUBMITTED     Comment: Performed at [facility], [address] [place], [place] [address]    US  Abdomen Limited RUQ (LIVER/GB) Result Date: 10/07/2024 EXAM: Right Upper Quadrant Abdominal Ultrasound 10/07/2024 06:19:00 AM TECHNIQUE: Real-time ultrasonography of the right upper quadrant of the abdomen was performed. COMPARISON: CT of the abdomen and pelvis dated 10/07/2024. CLINICAL HISTORY: Transaminitis. FINDINGS: LIVER: The liver demonstrates normal echogenicity. No intrahepatic biliary ductal dilatation. No evidence of mass. BILIARY SYSTEM: No pericholecystic fluid or wall thickening. There are several stones within the gallbladder, with the largest measuring approximately 11 mm in diameter. The gallbladder is contracted. The patient is not focally tender over the gallbladder. The common bile duct measures 4 mm in diameter. RIGHT KIDNEY: The right kidney is grossly unremarkable in appearances without evidence of hydronephrosis, echogenic calculi or worrisome mass lesions. PANCREAS: Visualized portions of the pancreas are unremarkable. OTHER: No right upper quadrant ascites. IMPRESSION: 1. Contracted gallbladder with cholelithiasis, largest stone measuring approximately 11 mm. 2. No sonographic Murphy sign. Electronically signed by: Evalene Coho MD 10/07/2024 06:32 AM EST RP Workstation: [id number]   CT ABDOMEN PELVIS W CONTRAST Result Date: 10/07/2024 EXAM: CT ABDOMEN AND PELVIS WITH CONTRAST 10/07/2024 03:42:08 AM TECHNIQUE: CT of the abdomen and pelvis was performed with the administration of 100 mL of iohexol  (OMNIPAQUE ) 300 MG/ML solution. Multiplanar  reformatted images are provided for review. Automated exposure control, iterative reconstruction, and/or weight-based adjustment of the mA/kV was utilized to reduce the radiation dose to as low as reasonably achievable. COMPARISON: None available. CLINICAL HISTORY: Epigastric pain for 2 months. FINDINGS: LOWER CHEST: No acute abnormality. LIVER: The liver is unremarkable. GALLBLADDER AND BILE DUCTS: Gallbladder is unremarkable. No biliary ductal dilatation. SPLEEN: No acute abnormality. PANCREAS: No acute abnormality. ADRENAL GLANDS: No acute abnormality. KIDNEYS, URETERS AND BLADDER: No stones in the kidneys or ureters. No hydronephrosis. No perinephric or periureteral  stranding. Urinary bladder is unremarkable. GI AND BOWEL: Stomach demonstrates no acute abnormality. There is no bowel obstruction. PERITONEUM AND RETROPERITONEUM: No ascites. No free air. VASCULATURE: Aorta is normal in caliber. LYMPH NODES: No lymphadenopathy. REPRODUCTIVE ORGANS: No acute abnormality. BONES AND SOFT TISSUES: No acute osseous abnormality. No focal soft tissue abnormality. IMPRESSION: 1. No acute findings in the abdomen or pelvis. Electronically signed by: Oneil Devonshire MD 10/07/2024 03:45 AM EST RP Workstation: [id number]    ROS negative except above she is doing well in recovery although has just recently woken up Blood pressure 125/79, pulse 78, temperature 98.6 F (37 C), resp. rate 18, weight 62.6 kg, SpO2 97%. Physical Exam patient not examined today will be tomorrow prior to her procedure labs ultrasound CT and IntraOp cholangiogram reviewed  Assessment/Plan: Positive IntraOp cholangiogram Plan: We briefly discussed ERCP and the IOC findings and we will proceed tomorrow unless a new problem arises and we will plan on discussing it further tomorrow before her procedure  [facility] E 10/07/2024, 3:38 PM         [1] No Known Allergies

## 2024-10-07 NOTE — ED Notes (Signed)
 Patient given CHG wipes and changed into gown for surgery.

## 2024-10-07 NOTE — Anesthesia Procedure Notes (Signed)
 Procedure Name: Intubation Date/Time: 10/07/2024 1:38 PM  Performed by: Therisa Doyal CROME, CRNAPre-anesthesia Checklist: Patient identified, Emergency Drugs available, Suction available and Patient being monitored Patient Re-evaluated:Patient Re-evaluated prior to induction Oxygen Delivery Method: Circle system utilized Preoxygenation: Pre-oxygenation with 100% oxygen Induction Type: IV induction Ventilation: Mask ventilation without difficulty and Oral airway inserted - appropriate to patient size Laryngoscope Size: Cleotilde and 2 Grade View: Grade I Tube type: Oral Tube size: 7.0 mm Number of attempts: 1 Airway Equipment and Method: Stylet Placement Confirmation: ETT inserted through vocal cords under direct vision, positive ETCO2 and breath sounds checked- equal and bilateral Secured at: 21 cm Tube secured with: Tape Dental Injury: Teeth and Oropharynx as per pre-operative assessment

## 2024-10-08 ENCOUNTER — Inpatient Hospital Stay (HOSPITAL_COMMUNITY)

## 2024-10-08 ENCOUNTER — Encounter (HOSPITAL_COMMUNITY): Payer: Self-pay

## 2024-10-08 ENCOUNTER — Other Ambulatory Visit (HOSPITAL_COMMUNITY): Payer: Self-pay

## 2024-10-08 ENCOUNTER — Inpatient Hospital Stay (HOSPITAL_COMMUNITY): Admitting: Anesthesiology

## 2024-10-08 ENCOUNTER — Encounter (HOSPITAL_COMMUNITY): Admission: EM | Disposition: A | Discharge status: Home / Self Care | Payer: Self-pay | Source: Home / Self Care

## 2024-10-08 DIAGNOSIS — K828 Other specified diseases of gallbladder: Secondary | ICD-10-CM

## 2024-10-08 HISTORY — PX: ERCP: SHX5425

## 2024-10-08 LAB — SURGICAL PATHOLOGY

## 2024-10-08 SURGERY — ERCP, WITH INTERVENTION IF INDICATED
Anesthesia: General

## 2024-10-08 MED ORDER — DICLOFENAC SUPPOSITORY 100 MG
RECTAL | Status: AC
  Filled 2024-10-08: qty 1

## 2024-10-08 MED ORDER — MIDAZOLAM HCL 5 MG/5ML IJ SOLN
INTRAMUSCULAR | Status: DC | PRN
  Administered 2024-10-08: 09:00:00 2 mg via INTRAVENOUS

## 2024-10-08 MED ORDER — LIDOCAINE 2% (20 MG/ML) 5 ML SYRINGE
INTRAMUSCULAR | Status: DC | PRN
  Administered 2024-10-08: 09:00:00 60 mg via INTRAVENOUS

## 2024-10-08 MED ORDER — FENTANYL CITRATE (PF) 100 MCG/2ML IJ SOLN
INTRAMUSCULAR | Status: DC | PRN
  Administered 2024-10-08 (×2): 50 ug via INTRAVENOUS

## 2024-10-08 MED ORDER — SODIUM CHLORIDE 0.9 % IV SOLN
INTRAVENOUS | Status: DC | PRN
  Administered 2024-10-08: 10:00:00 10 mL

## 2024-10-08 MED ORDER — IBUPROFEN 600 MG PO TABS
600.0000 mg | ORAL_TABLET | Freq: Three times a day (TID) | ORAL | 0 refills | Status: AC | PRN
  Filled 2024-10-08: qty 30, 10d supply, fill #0

## 2024-10-08 MED ORDER — ACETAMINOPHEN 500 MG PO TABS
1000.0000 mg | ORAL_TABLET | Freq: Four times a day (QID) | ORAL | Status: DC
  Administered 2024-10-08 (×2): 1000 mg via ORAL
  Filled 2024-10-08 (×2): qty 2

## 2024-10-08 MED ORDER — SODIUM CHLORIDE 0.9 % IV SOLN
INTRAVENOUS | Status: AC | PRN
  Administered 2024-10-08: 09:00:00 500 mL via INTRAMUSCULAR

## 2024-10-08 MED ORDER — SUGAMMADEX SODIUM 200 MG/2ML IV SOLN
INTRAVENOUS | Status: DC | PRN
  Administered 2024-10-08: 10:00:00 200 mg via INTRAVENOUS

## 2024-10-08 MED ORDER — CIPROFLOXACIN IN D5W 400 MG/200ML IV SOLN
INTRAVENOUS | Status: AC
  Filled 2024-10-08: qty 200

## 2024-10-08 MED ORDER — MIDAZOLAM HCL 2 MG/2ML IJ SOLN
INTRAMUSCULAR | Status: AC
  Filled 2024-10-08: qty 2

## 2024-10-08 MED ORDER — ROCURONIUM BROMIDE 10 MG/ML (PF) SYRINGE
PREFILLED_SYRINGE | INTRAVENOUS | Status: DC | PRN
  Administered 2024-10-08: 09:00:00 40 mg via INTRAVENOUS

## 2024-10-08 MED ORDER — PROPOFOL 500 MG/50ML IV EMUL
INTRAVENOUS | Status: AC
  Filled 2024-10-08: qty 100

## 2024-10-08 MED ORDER — MORPHINE SULFATE (PF) 2 MG/ML IV SOLN: 1.0000 mg | INTRAVENOUS | Status: DC | PRN

## 2024-10-08 MED ORDER — IBUPROFEN 600 MG PO TABS: 600.0000 mg | ORAL_TABLET | Freq: Three times a day (TID) | ORAL | Status: DC | PRN

## 2024-10-08 MED ORDER — DEXAMETHASONE SOD PHOSPHATE PF 10 MG/ML IJ SOLN
INTRAMUSCULAR | Status: DC | PRN
  Administered 2024-10-08: 10:00:00 10 mg via INTRAVENOUS

## 2024-10-08 MED ORDER — CIPROFLOXACIN IN D5W 400 MG/200ML IV SOLN
INTRAVENOUS | Status: DC | PRN
  Administered 2024-10-08: 10:00:00 400 mg via INTRAVENOUS

## 2024-10-08 MED ORDER — PROPOFOL 10 MG/ML IV BOLUS
INTRAVENOUS | Status: DC | PRN
  Administered 2024-10-08: 09:00:00 150 mg via INTRAVENOUS

## 2024-10-08 MED ORDER — EPHEDRINE SULFATE-NACL 50-0.9 MG/10ML-% IV SOSY
PREFILLED_SYRINGE | INTRAVENOUS | Status: DC | PRN
  Administered 2024-10-08 (×2): 7.5 mg via INTRAVENOUS

## 2024-10-08 MED ORDER — TRAMADOL HCL 50 MG PO TABS: 50.0000 mg | ORAL_TABLET | Freq: Four times a day (QID) | ORAL | Status: DC | PRN

## 2024-10-08 MED ORDER — ONDANSETRON HCL 4 MG/2ML IJ SOLN
INTRAMUSCULAR | Status: DC | PRN
  Administered 2024-10-08: 10:00:00 4 mg via INTRAVENOUS

## 2024-10-08 MED ORDER — FENTANYL CITRATE (PF) 100 MCG/2ML IJ SOLN
INTRAMUSCULAR | Status: AC
  Filled 2024-10-08: qty 2

## 2024-10-08 MED ORDER — GLUCAGON HCL RDNA (DIAGNOSTIC) 1 MG IJ SOLR
INTRAMUSCULAR | Status: AC
  Filled 2024-10-08: qty 1

## 2024-10-08 MED ORDER — ACETAMINOPHEN 500 MG PO TABS: 1000.0000 mg | ORAL_TABLET | Freq: Four times a day (QID) | ORAL | Status: AC

## 2024-10-08 MED ADMIN — Lactated Ringer's Solution: 1000 mL | INTRAVENOUS | @ 09:00:00 | NDC 00338011704

## 2024-10-08 MED FILL — Propofol IV Emul 200 MG/20ML (10 MG/ML): INTRAVENOUS | Qty: 20 | Status: AC

## 2024-10-08 NOTE — Progress Notes (Signed)
 * Day of Surgery *  Subjective: Sore this am as expected, but otherwise no complaints.  Tolerated CLD last night.  ROS: See above, otherwise other systems negative  Objective: Vital signs in last 24 hours: Temp:  [97.7 F (36.5 C)-98.6 F (37 C)] 97.7 F (36.5 C) (12/18 0907) Pulse Rate:  [59-104] 59 (12/18 0907) Resp:  [12-24] 19 (12/18 0907) BP: (101-137)/(63-89) 115/72 (12/18 0907) SpO2:  [92 %-100 %] 98 % (12/18 0907) Weight:  [76.2 kg] 76.2 kg (12/18 0907) Last BM Date : 10/06/24  Intake/Output from previous day: 12/17 0701 - 12/18 0700 In: 1500.6 [P.O.:150; I.V.:1250.6; IV Piggyback:100] Out: 1000 [Urine:1000] Intake/Output this shift: Total I/O In: 339 [I.V.:339] Out: -   PE: Abd: soft, minimally tender, +BS, ND, incisions c/d/i  Lab Results:  Recent Labs    10/06/24 2325  WBC 7.2  HGB 12.2  HCT 36.0  PLT 348   BMET Recent Labs    10/06/24 2325  NA 138  K 3.5  CL 104  CO2 18*  GLUCOSE 116*  BUN 10  CREATININE 0.94  CALCIUM 9.3   PT/INR No results for input(s): LABPROT, INR in the last 72 hours. CMP     Component Value Date/Time   NA 138 10/06/2024 2325   K 3.5 10/06/2024 2325   CL 104 10/06/2024 2325   CO2 18 (L) 10/06/2024 2325   GLUCOSE 116 (H) 10/06/2024 2325   BUN 10 10/06/2024 2325   CREATININE 0.94 10/06/2024 2325   CALCIUM 9.3 10/06/2024 2325   PROT 7.7 10/06/2024 2325   ALBUMIN 4.4 10/06/2024 2325   AST 131 (H) 10/06/2024 2325   ALT 131 (H) 10/06/2024 2325   ALKPHOS 144 (H) 10/06/2024 2325   BILITOT 1.1 10/06/2024 2325   GFRNONAA >60 10/06/2024 2325   Lipase     Component Value Date/Time   LIPASE 66 (H) 10/06/2024 2325       Studies/Results: DG Cholangiogram Operative Result Date: 10/07/2024 EXAM: INTRAOPERATIVE CHOLANGIOGRAM TECHNIQUE: Fluoroscopy provided by the radiology department. Radiologist was not present during procedure. Image(s) submitted for review. FLUOROSCOPY DOSE AND TYPE: Radiation Dose  Index: Reference Air Kerma (in mGy) = 2.52. Fluoro time 10 seconds. COMPARISON: None available. CLINICAL HISTORY: [id number] Surgery, elective [id number] Surgery, elective [phone] FINDINGS: Status post cholecystectomy without leak. Contrast was injected into the cystic duct remnant. Visualization of the intrahepatic and extrahepatic biliary tree is noted. A filling defect is seen within the distal most aspect of the common bile duct consistent with choledocholithiasis. Free flow of contrast into the duodenum is noted. IMPRESSION: 1. Filling defect in the distal common bile duct consistent with choledocholithiasis. 2. Free flow of contrast into the duodenum. Electronically signed by: Oneil Devonshire MD 10/07/2024 07:25 PM EST RP Workstation: [id number]   US  Abdomen Limited RUQ (LIVER/GB) Result Date: 10/07/2024 EXAM: Right Upper Quadrant Abdominal Ultrasound 10/07/2024 06:19:00 AM TECHNIQUE: Real-time ultrasonography of the right upper quadrant of the abdomen was performed. COMPARISON: CT of the abdomen and pelvis dated 10/07/2024. CLINICAL HISTORY: Transaminitis. FINDINGS: LIVER: The liver demonstrates normal echogenicity. No intrahepatic biliary ductal dilatation. No evidence of mass. BILIARY SYSTEM: No pericholecystic fluid or wall thickening. There are several stones within the gallbladder, with the largest measuring approximately 11 mm in diameter. The gallbladder is contracted. The patient is not focally tender over the gallbladder. The common bile duct measures 4 mm in diameter. RIGHT KIDNEY: The right kidney is grossly unremarkable in appearances without evidence of hydronephrosis, echogenic calculi or worrisome mass lesions.  PANCREAS: Visualized portions of the pancreas are unremarkable. OTHER: No right upper quadrant ascites. IMPRESSION: 1. Contracted gallbladder with cholelithiasis, largest stone measuring approximately 11 mm. 2. No sonographic Murphy sign. Electronically signed by: Evalene Coho MD 10/07/2024  06:32 AM EST RP Workstation: [id number]   CT ABDOMEN PELVIS W CONTRAST Result Date: 10/07/2024 EXAM: CT ABDOMEN AND PELVIS WITH CONTRAST 10/07/2024 03:42:08 AM TECHNIQUE: CT of the abdomen and pelvis was performed with the administration of 100 mL of iohexol  (OMNIPAQUE ) 300 MG/ML solution. Multiplanar reformatted images are provided for review. Automated exposure control, iterative reconstruction, and/or weight-based adjustment of the mA/kV was utilized to reduce the radiation dose to as low as reasonably achievable. COMPARISON: None available. CLINICAL HISTORY: Epigastric pain for 2 months. FINDINGS: LOWER CHEST: No acute abnormality. LIVER: The liver is unremarkable. GALLBLADDER AND BILE DUCTS: Gallbladder is unremarkable. No biliary ductal dilatation. SPLEEN: No acute abnormality. PANCREAS: No acute abnormality. ADRENAL GLANDS: No acute abnormality. KIDNEYS, URETERS AND BLADDER: No stones in the kidneys or ureters. No hydronephrosis. No perinephric or periureteral stranding. Urinary bladder is unremarkable. GI AND BOWEL: Stomach demonstrates no acute abnormality. There is no bowel obstruction. PERITONEUM AND RETROPERITONEUM: No ascites. No free air. VASCULATURE: Aorta is normal in caliber. LYMPH NODES: No lymphadenopathy. REPRODUCTIVE ORGANS: No acute abnormality. BONES AND SOFT TISSUES: No acute osseous abnormality. No focal soft tissue abnormality. IMPRESSION: 1. No acute findings in the abdomen or pelvis. Electronically signed by: Oneil Devonshire MD 10/07/2024 03:45 AM EST RP Workstation: [id number]    Anti-infectives: Anti-infectives (From admission, onward)    Start     Dose/Rate Route Frequency Ordered Stop   10/07/24 1045  ceFAZolin  (ANCEF ) IVPB 2g/100 mL premix        2 g 200 mL/hr over 30 Minutes Intravenous  Once 10/07/24 1032 10/07/24 1350        Assessment/Plan POD 1, s/p lap chole with nonobstructing CBD stone found, Dr. Curvin, 12/17 -CMET in am -ERCP today by Dr. Rosalie -may have  CLAD, ADAT from our standpoint following ERCP -if all goes well, likely home tomorrow -multi-modal pain control   FEN - NPO for ERCP, CLD, ADAT afters VTE - SCDs ID - ancef  on call for ERCP, otherwise none needed.   LOS: 1 day    Burnard FORBES Banter , [facility] 10/08/2024, 9:41 AM Please see Amion for pager number during day hours 7:00am-4:30pm or 7:00am -11:30am on weekends

## 2024-10-08 NOTE — Transfer of Care (Signed)
 Immediate Anesthesia Transfer of Care Note  Patient: Isabel Rivera  Procedure(s) Performed: Procedures: ERCP, WITH INTERVENTION IF INDICATED (N/A)  Patient Location: PACU  Anesthesia Type:General  Level of Consciousness:  sedated, patient cooperative and responds to stimulation  Airway & Oxygen Therapy:Patient Spontanous Breathing and Patient connected to face mask oxgen  Post-op Assessment:  Report given to PACU RN and Post -op Vital signs reviewed and stable  Post vital signs:  Reviewed and stable  Last Vitals:  Vitals:   10/08/24 0907 10/08/24 1013  BP: 115/72 130/63  Pulse: (!) 59 (!) 107  Resp: 19 (!) 21  Temp: 36.5 C   SpO2: 98% 100%    Complications: No apparent anesthesia complications

## 2024-10-08 NOTE — Plan of Care (Signed)
°  Problem: Education: Goal: Knowledge of General Education information will improve Description: Including pain rating scale, medication(s)/side effects and non-pharmacologic comfort measures Outcome: Adequate for Discharge   Problem: Health Behavior/Discharge Planning: Goal: Ability to manage health-related needs will improve Outcome: Adequate for Discharge   Problem: Clinical Measurements: Goal: Ability to maintain clinical measurements within normal limits will improve 10/08/2024 1645 by Feliberto Graces, RN Outcome: Adequate for Discharge 10/08/2024 1536 by Feliberto Graces, RN Outcome: Progressing Goal: Will remain free from infection 10/08/2024 1645 by Feliberto Graces, RN Outcome: Adequate for Discharge 10/08/2024 1536 by Feliberto Graces, RN Outcome: Progressing Goal: Diagnostic test results will improve 10/08/2024 1645 by Feliberto Graces, RN Outcome: Adequate for Discharge 10/08/2024 1536 by Feliberto Graces, RN Outcome: Progressing Goal: Respiratory complications will improve Outcome: Adequate for Discharge Goal: Cardiovascular complication will be avoided Outcome: Adequate for Discharge

## 2024-10-08 NOTE — Anesthesia Postprocedure Evaluation (Signed)
 Anesthesia Post Note  Patient: Isabel Rivera  Procedure(s) Performed: ERCP, WITH INTERVENTION IF INDICATED     Patient location during evaluation: PACU Anesthesia Type: General Level of consciousness: awake and alert Pain management: pain level controlled Vital Signs Assessment: post-procedure vital signs reviewed and stable Respiratory status: spontaneous breathing, nonlabored ventilation, respiratory function stable and patient connected to nasal cannula oxygen Cardiovascular status: blood pressure returned to baseline and stable Postop Assessment: no apparent nausea or vomiting Anesthetic complications: no   No notable events documented.  Last Vitals:  Vitals:   10/08/24 1309 10/08/24 1402  BP: 106/60 103/67  Pulse: 75 75  Resp: 18 18  Temp: 36.8 C 36.8 C  SpO2: 98%     Last Pain:  Vitals:   10/08/24 1609  TempSrc:   PainSc: 2                  Epifanio Lamar BRAVO

## 2024-10-08 NOTE — Plan of Care (Signed)
 ?  Problem: Clinical Measurements: ?Goal: Ability to maintain clinical measurements within normal limits will improve ?Outcome: Progressing ?Goal: Will remain free from infection ?Outcome: Progressing ?Goal: Diagnostic test results will improve ?Outcome: Progressing ?  ?

## 2024-10-08 NOTE — Progress Notes (Signed)
°   10/08/24 1035  TOC Brief Assessment  Insurance and Status Reviewed  Patient has primary care physician  (none on record; pt insured)  Home environment has been reviewed home with parent  Prior level of function: independent  Prior/Current Home Services No current home services  Social Drivers of Health Review SDOH reviewed no interventions necessary  Readmission risk has been reviewed Yes  Transition of care needs no transition of care needs at this time

## 2024-10-08 NOTE — Progress Notes (Signed)
 Isabel Rivera 9:15 AM  Subjective: Patient seen and examined and we rediscussed the procedure and she has a different type of abdominal soreness no new complaints  Objective: Vital signs stable afebrile no acute distress exam please see preassessment evaluation no new labs IOC reviewed  Assessment: CBD stone  Plan: The risk benefits methods and success rate of ERCP was discussed with the patient we will proceed today with anesthesia assistance  [facility] E  office [phone] After 5PM or if no answer call [phone]

## 2024-10-08 NOTE — Anesthesia Procedure Notes (Signed)
 Procedure Name: Intubation Date/Time: 10/08/2024 9:30 AM  Performed by: Vincenzo Show, CRNAPre-anesthesia Checklist: Patient identified, Emergency Drugs available, Suction available, Patient being monitored and Timeout performed Patient Re-evaluated:Patient Re-evaluated prior to induction Oxygen Delivery Method: Circle system utilized Preoxygenation: Pre-oxygenation with 100% oxygen Induction Type: IV induction Ventilation: Mask ventilation without difficulty Laryngoscope Size: Mac and 3 Grade View: Grade I Tube type: Oral Tube size: 7.0 mm Number of attempts: 1 Airway Equipment and Method: Stylet Placement Confirmation: ETT inserted through vocal cords under direct vision, positive ETCO2, CO2 detector and breath sounds checked- equal and bilateral Secured at: 22 cm Tube secured with: Tape Dental Injury: Teeth and Oropharynx as per pre-operative assessment  Comments: ATOI

## 2024-10-08 NOTE — Op Note (Signed)
 [facility] Patient Name: Isabel Rivera Procedure Date: 10/08/2024 MRN: [id number] Attending MD: Oliva Boots , MD, [id number] Date of Birth: 02-16-2001 CSN: [id number] Age: 23 Admit Type: Inpatient Procedure:                ERCP Indications:              Common bile duct stone(s), Abnormal intraoperative                            cholangiogram Providers:                Oliva Boots, MD, Willy Hummer, RN, Lorrayne Kitty,                            Technician Referring MD:              Medicines:                General Anesthesia Complications:            No immediate complications. Estimated Blood Loss:     Estimated blood loss: none. Procedure:                Pre-Anesthesia Assessment:                           - Prior to the procedure, a History and Physical                            was performed, and patient medications and                            allergies were reviewed. The patient's tolerance of                            previous anesthesia was also reviewed. The risks                            and benefits of the procedure and the sedation                            options and risks were discussed with the patient.                            All questions were answered, and informed consent                            was obtained. Prior Anticoagulants: The patient has                            taken no anticoagulant or antiplatelet agents. ASA                            Grade Assessment: I - A normal, healthy patient.                            After reviewing the risks and  benefits, the patient                            was deemed in satisfactory condition to undergo the                            procedure.                           After obtaining informed consent, the scope was                            passed under direct vision. Throughout the                            procedure, the patient's blood pressure, pulse, and                             oxygen saturations were monitored continuously. The                            [id number]-[id number] ([id number] Olympus duodenoscope was                            introduced through the mouth, and used to inject                            contrast into and used to cannulate the bile duct.                            The ERCP was accomplished without difficulty. The                            patient tolerated the procedure well. Findings:      The major papilla was normal. Deep selective cannulation was readily       obtained and there was no pancreatic duct injection or wire advancement       throughout the procedure and no obvious stone was seen on initial       cholangiogram and we proceeded with a biliary sphincterotomy was made       with a Hydratome sphincterotome using ERBE electrocautery. There was no       post-sphincterotomy bleeding. We proceeded until we had adequate biliary       drainage and could get the fully bowed sphincterotome easily in and out       of the duct and to discover objects, the biliary tree was swept with a       12 mm balloon starting at the bifurcation. Minimal sludge was swept from       the duct. No obvious stone was seen on multiple balloon pull-through's       and there was no filling defects on occlusion cholangiogram and there       was adequate biliary drainage and the wire and the balloon were removed       and the scope was removed and the patient tolerated the procedure well       there was  no obvious immediate complication Impression:               - The major papilla appeared normal.                           - A biliary sphincterotomy was performed.                           - The biliary tree was swept and sludge was found. Moderate Sedation:      Not Applicable - Patient had care per Anesthesia. Recommendation:           - Clear liquid diet for 6 hours. If doing well at 4                            PM may have soft solid                           -  Continue present medications.                           - Return to GI clinic PRN. Hopefully can go home                            this evening or tomorrow if tolerating diet                           - Telephone GI clinic if symptomatic PRN. Recheck                            CBC and liver tests either tomorrow or as an                            outpatient and ensure they are back to normal Procedure Code(s):        --- Professional ---                           [phone], Endoscopic retrograde                            cholangiopancreatography (ERCP); with removal of                            calculi/debris from biliary/pancreatic duct(s)                           43262, Endoscopic retrograde                            cholangiopancreatography (ERCP); with                            sphincterotomy/papillotomy Diagnosis Code(s):        --- Professional ---                           K80.50, Calculus  of bile duct without cholangitis                            or cholecystitis without obstruction                           R93.2, Abnormal findings on diagnostic imaging of                            liver and biliary tract CPT copyright 2022 [employer]. All rights reserved. The codes documented in this report are preliminary and upon coder review may  be revised to meet current compliance requirements. Oliva Boots, MD 10/08/2024 10:10:32 AM This report has been signed electronically. Number of Addenda: 0

## 2024-10-08 NOTE — Anesthesia Preprocedure Evaluation (Signed)
 Anesthesia Evaluation  Patient identified by MRN, date of birth, ID band Patient awake    Reviewed: Allergy & Precautions, NPO status , Patient's Chart, lab work & pertinent test results  History of Anesthesia Complications Negative for: history of anesthetic complications  Airway Mallampati: II  TM Distance: >3 FB Neck ROM: Full    Dental no notable dental hx. (+) Teeth Intact   Pulmonary neg sleep apnea, neg COPD, Patient abstained from smoking.   Pulmonary exam normal breath sounds clear to auscultation       Cardiovascular Exercise Tolerance: Good METS(-) hypertension(-) CAD and (-) Past MI negative cardio ROS (-) dysrhythmias  Rhythm:Regular Rate:Normal - Systolic murmurs    Neuro/Psych negative neurological ROS  negative psych ROS   GI/Hepatic ,neg GERD  ,,(+)     (-) substance abuse  Cholecystitis. Denies N/V.   Endo/Other  neg diabetes    Renal/GU negative Renal ROS     Musculoskeletal   Abdominal   Peds  Hematology negative hematology ROS (+)   Anesthesia Other Findings History reviewed. No pertinent past medical history.  Reproductive/Obstetrics                              Anesthesia Physical Anesthesia Plan  ASA: 2  Anesthesia Plan: General   Post-op Pain Management: Tylenol  PO (pre-op)* and Toradol  IV (intra-op)*   Induction: Intravenous  PONV Risk Score and Plan: 4 or greater and Ondansetron , Dexamethasone  and Midazolam   Airway Management Planned: Oral ETT  Additional Equipment: None  Intra-op Plan:   Post-operative Plan: Extubation in OR  Informed Consent: I have reviewed the patients History and Physical, chart, labs and discussed the procedure including the risks, benefits and alternatives for the proposed anesthesia with the patient or authorized representative who has indicated his/her understanding and acceptance.     Dental advisory given  Plan  Discussed with: CRNA and Surgeon  Anesthesia Plan Comments: ( )         Anesthesia Quick Evaluation

## 2024-10-08 NOTE — Discharge Summary (Signed)
 [facility] Discharge Summary   Patient ID: Isabel Rivera MRN: [id number] DOB/AGE: [birth year] 23 y.o.  Admit date: 10/07/2024 Discharge date: 10/08/2024  Admitting Diagnosis: Cholelithiasis  Discharge Diagnosis Choledocholithiasis  Consultants Gastroenterology  Imaging: DG Cholangiogram Operative Result Date: 10/07/2024 EXAM: INTRAOPERATIVE CHOLANGIOGRAM TECHNIQUE: Fluoroscopy provided by the radiology department. Radiologist was not present during procedure. Image(s) submitted for review. FLUOROSCOPY DOSE AND TYPE: Radiation Dose Index: Reference Air Kerma (in mGy) = 2.52. Fluoro time 10 seconds. COMPARISON: None available. CLINICAL HISTORY: [id number] Surgery, elective [id number] Surgery, elective [phone] FINDINGS: Status post cholecystectomy without leak. Contrast was injected into the cystic duct remnant. Visualization of the intrahepatic and extrahepatic biliary tree is noted. A filling defect is seen within the distal most aspect of the common bile duct consistent with choledocholithiasis. Free flow of contrast into the duodenum is noted. IMPRESSION: 1. Filling defect in the distal common bile duct consistent with choledocholithiasis. 2. Free flow of contrast into the duodenum. Electronically signed by: Oneil Devonshire MD 10/07/2024 07:25 PM EST RP Workstation: [id number]   US  Abdomen Limited RUQ (LIVER/GB) Result Date: 10/07/2024 EXAM: Right Upper Quadrant Abdominal Ultrasound 10/07/2024 06:19:00 AM TECHNIQUE: Real-time ultrasonography of the right upper quadrant of the abdomen was performed. COMPARISON: CT of the abdomen and pelvis dated 10/07/2024. CLINICAL HISTORY: Transaminitis. FINDINGS: LIVER: The liver demonstrates normal echogenicity. No intrahepatic biliary ductal dilatation. No evidence of mass. BILIARY SYSTEM: No pericholecystic fluid or wall thickening. There are several stones within the gallbladder, with the largest measuring approximately 11 mm in diameter. The  gallbladder is contracted. The patient is not focally tender over the gallbladder. The common bile duct measures 4 mm in diameter. RIGHT KIDNEY: The right kidney is grossly unremarkable in appearances without evidence of hydronephrosis, echogenic calculi or worrisome mass lesions. PANCREAS: Visualized portions of the pancreas are unremarkable. OTHER: No right upper quadrant ascites. IMPRESSION: 1. Contracted gallbladder with cholelithiasis, largest stone measuring approximately 11 mm. 2. No sonographic Murphy sign. Electronically signed by: Evalene Coho MD 10/07/2024 06:32 AM EST RP Workstation: [id number]   CT ABDOMEN PELVIS W CONTRAST Result Date: 10/07/2024 EXAM: CT ABDOMEN AND PELVIS WITH CONTRAST 10/07/2024 03:42:08 AM TECHNIQUE: CT of the abdomen and pelvis was performed with the administration of 100 mL of iohexol  (OMNIPAQUE ) 300 MG/ML solution. Multiplanar reformatted images are provided for review. Automated exposure control, iterative reconstruction, and/or weight-based adjustment of the mA/kV was utilized to reduce the radiation dose to as low as reasonably achievable. COMPARISON: None available. CLINICAL HISTORY: Epigastric pain for 2 months. FINDINGS: LOWER CHEST: No acute abnormality. LIVER: The liver is unremarkable. GALLBLADDER AND BILE DUCTS: Gallbladder is unremarkable. No biliary ductal dilatation. SPLEEN: No acute abnormality. PANCREAS: No acute abnormality. ADRENAL GLANDS: No acute abnormality. KIDNEYS, URETERS AND BLADDER: No stones in the kidneys or ureters. No hydronephrosis. No perinephric or periureteral stranding. Urinary bladder is unremarkable. GI AND BOWEL: Stomach demonstrates no acute abnormality. There is no bowel obstruction. PERITONEUM AND RETROPERITONEUM: No ascites. No free air. VASCULATURE: Aorta is normal in caliber. LYMPH NODES: No lymphadenopathy. REPRODUCTIVE ORGANS: No acute abnormality. BONES AND SOFT TISSUES: No acute osseous abnormality. No focal soft tissue  abnormality. IMPRESSION: 1. No acute findings in the abdomen or pelvis. Electronically signed by: Oneil Devonshire MD 10/07/2024 03:45 AM EST RP Workstation: [id number]    Procedures Dr. Deward Null (10/07/24) - Laparoscopic Cholecystectomy with IOC Dr. Oliva Boots (10/08/24) - ERCP  Hospital Course:  Patient is a 23 year old female who presented to the ED with abdominal pain.  Workup showed cholelithiasis with possible cholecystitis.  Patient was admitted and underwent procedure listed above. Intra-operative cholangiogram was positive for distal common bile duct stone and GI consulted. Patient underwent ERCP 10/08/24 as listed above. Tolerated procedure well and was transferred to the floor.  Diet was advanced as tolerated.  On POD1, the patient was voiding well, tolerating diet, ambulating well, pain well controlled, vital signs stable, incisions c/d/i and felt stable for discharge home.  Patient will follow up in our office in 3-4 weeks and knows to call with questions or concerns.    I or a member of my team have reviewed this patient in the Controlled Substance Database.   Allergies as of 10/08/2024   No Known Allergies      Medication List     TAKE these medications    acetaminophen  500 MG tablet Commonly known as: TYLENOL  Take 2 tablets (1,000 mg total) by mouth every 6 (six) hours. What changed:  medication strength how much to take when to take this reasons to take this   bismuth subsalicylate 262 MG/15ML suspension Commonly known as: PEPTO BISMOL Take 30 mLs by mouth every 6 (six) hours as needed.   ibuprofen  600 MG tablet Commonly known as: ADVIL  Take 1 tablet (600 mg total) by mouth 3 (three) times daily as needed for mild pain (pain score 1-3).   Sprintec 28 0.25-35 MG-MCG tablet Generic drug: norgestimate-ethinyl estradiol Take 1 tablet by mouth daily.          Follow-up Information     Maczis, Puja Gosai, PA-C Follow up on 11/04/2024.   Specialty: General  Surgery Why: 1:30pm, Arrive 30 minutes prior to your appointment time, Please bring your insurance card and photo ID Contact information: [address] [place] [address] [facility] [place] [address] [phone]                 Signed: Burnard JONELLE Louder , [facility] 10/08/2024, 3:32 PM Please see Amion for pager number during day hours 7:00am-4:30pm

## 2024-10-08 NOTE — Progress Notes (Signed)
 Discharge medication delivered to patient at the bedside

## 2024-10-08 NOTE — Progress Notes (Signed)
Pt was discharged home today. Instructions were reviewed with patient, and questions were answered. Pt was taken to main entrance via wheelchair by NT.  

## 2024-10-08 NOTE — Anesthesia Postprocedure Evaluation (Signed)
 Anesthesia Post Note  Patient: Isabel Rivera  Procedure(s) Performed: LAPAROSCOPIC CHOLECYSTECTOMY WITH INTRAOPERATIVE CHOLANGIOGRAM     Patient location during evaluation: PACU Anesthesia Type: General Level of consciousness: awake and alert Pain management: pain level controlled Vital Signs Assessment: post-procedure vital signs reviewed and stable Respiratory status: spontaneous breathing, nonlabored ventilation and respiratory function stable Cardiovascular status: stable and blood pressure returned to baseline Anesthetic complications: no   No notable events documented.  Last Vitals:  Vitals:   10/08/24 0547 10/08/24 0907  BP: 101/64 115/72  Pulse: 71 (!) 59  Resp: 16 19  Temp: 36.8 C 36.5 C  SpO2: 97% 98%    Last Pain:  Vitals:   10/08/24 0907  TempSrc: Temporal  PainSc: 5                  Debby FORBES Like

## 2024-10-11 ENCOUNTER — Encounter (HOSPITAL_COMMUNITY): Payer: Self-pay | Admitting: Gastroenterology
